# Patient Record
Sex: Female | Born: 1965 | Race: White | Hispanic: No | Smoking: Current some day smoker
Health system: Southern US, Community
[De-identification: ages and names within clinical notes are randomized; demographics above are authoritative.]

## PROBLEM LIST (undated history)

## (undated) DIAGNOSIS — E8801 Alpha-1-antitrypsin deficiency: Secondary | ICD-10-CM

## (undated) DIAGNOSIS — I639 Cerebral infarction, unspecified: Secondary | ICD-10-CM

## (undated) DIAGNOSIS — M329 Systemic lupus erythematosus, unspecified: Secondary | ICD-10-CM

## (undated) DIAGNOSIS — K859 Acute pancreatitis without necrosis or infection, unspecified: Secondary | ICD-10-CM

## (undated) HISTORY — PX: APPENDECTOMY: SHX54

## (undated) HISTORY — PX: HEMORROIDECTOMY: SUR656

## (undated) HISTORY — PX: CHOLECYSTECTOMY: SHX55

## (undated) HISTORY — PX: HERNIA REPAIR: SHX51

## (undated) HISTORY — PX: ABDOMINAL HYSTERECTOMY: SHX81

---

## 2009-06-01 ENCOUNTER — Emergency Department (HOSPITAL_COMMUNITY): Admission: EM | Admit: 2009-06-01 | Discharge: 2009-06-01 | Payer: Self-pay | Admitting: Emergency Medicine

## 2009-06-10 ENCOUNTER — Emergency Department (HOSPITAL_COMMUNITY): Admission: EM | Admit: 2009-06-10 | Discharge: 2009-06-11 | Payer: Self-pay | Admitting: Emergency Medicine

## 2009-07-05 ENCOUNTER — Emergency Department (HOSPITAL_COMMUNITY): Admission: EM | Admit: 2009-07-05 | Discharge: 2009-07-06 | Payer: Self-pay | Admitting: Emergency Medicine

## 2009-07-06 ENCOUNTER — Emergency Department (HOSPITAL_COMMUNITY): Admission: EM | Admit: 2009-07-06 | Discharge: 2009-07-06 | Payer: Self-pay | Admitting: Emergency Medicine

## 2009-07-09 ENCOUNTER — Emergency Department (HOSPITAL_COMMUNITY): Admission: EM | Admit: 2009-07-09 | Discharge: 2009-07-09 | Payer: Self-pay | Admitting: Emergency Medicine

## 2009-07-09 ENCOUNTER — Emergency Department (HOSPITAL_COMMUNITY): Admission: EM | Admit: 2009-07-09 | Discharge: 2009-07-10 | Payer: Self-pay | Admitting: Emergency Medicine

## 2009-07-11 ENCOUNTER — Emergency Department (HOSPITAL_COMMUNITY): Admission: EM | Admit: 2009-07-11 | Discharge: 2009-07-12 | Payer: Self-pay | Admitting: Emergency Medicine

## 2009-07-26 ENCOUNTER — Emergency Department (HOSPITAL_COMMUNITY): Admission: EM | Admit: 2009-07-26 | Discharge: 2009-07-26 | Payer: Self-pay | Admitting: Emergency Medicine

## 2010-04-21 LAB — CBC
HCT: 37.8 % (ref 36.0–46.0)
Hemoglobin: 14.3 g/dL (ref 12.0–15.0)
MCHC: 34.2 g/dL (ref 30.0–36.0)
MCHC: 34.4 g/dL (ref 30.0–36.0)
MCV: 92.2 fL (ref 78.0–100.0)
MCV: 92.5 fL (ref 78.0–100.0)
RBC: 4.11 MIL/uL (ref 3.87–5.11)
RBC: 4.24 MIL/uL (ref 3.87–5.11)
RDW: 13.1 % (ref 11.5–15.5)
RDW: 13.4 % (ref 11.5–15.5)
RDW: 13.5 % (ref 11.5–15.5)
WBC: 12.9 10*3/uL — ABNORMAL HIGH (ref 4.0–10.5)

## 2010-04-21 LAB — COMPREHENSIVE METABOLIC PANEL
ALT: 11 U/L (ref 0–35)
ALT: 17 U/L (ref 0–35)
AST: 31 U/L (ref 0–37)
Albumin: 4.1 g/dL (ref 3.5–5.2)
Albumin: 4.2 g/dL (ref 3.5–5.2)
Albumin: 4.2 g/dL (ref 3.5–5.2)
Alkaline Phosphatase: 87 U/L (ref 39–117)
Alkaline Phosphatase: 88 U/L (ref 39–117)
Alkaline Phosphatase: 95 U/L (ref 39–117)
BUN: 5 mg/dL — ABNORMAL LOW (ref 6–23)
BUN: 7 mg/dL (ref 6–23)
CO2: 24 mEq/L (ref 19–32)
CO2: 24 mEq/L (ref 19–32)
CO2: 25 mEq/L (ref 19–32)
Calcium: 9.3 mg/dL (ref 8.4–10.5)
Calcium: 9.4 mg/dL (ref 8.4–10.5)
Creatinine, Ser: 0.7 mg/dL (ref 0.4–1.2)
GFR calc Af Amer: >60 mL/min (ref >60)
GFR calc non Af Amer: >60 mL/min (ref >60)
GFR calc non Af Amer: >60 mL/min (ref >60)
GFR calc non Af Amer: >60 mL/min (ref >60)
Glucose, Bld: 115 mg/dL — ABNORMAL HIGH (ref 70–99)
Glucose, Bld: 93 mg/dL (ref 70–99)
Glucose, Bld: 97 mg/dL (ref 70–99)
Potassium: 3.5 mEq/L (ref 3.5–5.1)
Potassium: 4.2 mEq/L (ref 3.5–5.1)
Sodium: 141 mEq/L (ref 135–145)
Total Bilirubin: 0.2 mg/dL — ABNORMAL LOW (ref 0.3–1.2)
Total Bilirubin: 0.3 mg/dL (ref 0.3–1.2)
Total Bilirubin: 0.3 mg/dL (ref 0.3–1.2)
Total Bilirubin: 0.5 mg/dL (ref 0.3–1.2)
Total Protein: 6 g/dL (ref 6.0–8.3)
Total Protein: 6.8 g/dL (ref 6.0–8.3)
Total Protein: 6.9 g/dL (ref 6.0–8.3)

## 2010-04-21 LAB — SEDIMENTATION RATE: Sed Rate: 5 mm/hr (ref 0–22)

## 2010-04-21 LAB — URINALYSIS, ROUTINE W REFLEX MICROSCOPIC
Bilirubin Urine: NEGATIVE
Glucose, UA: NEGATIVE mg/dL
Glucose, UA: NEGATIVE mg/dL
Hgb urine dipstick: NEGATIVE
Hgb urine dipstick: NEGATIVE
Hgb urine dipstick: NEGATIVE
Protein, ur: NEGATIVE mg/dL
Protein, ur: NEGATIVE mg/dL
Protein, ur: NEGATIVE mg/dL
Specific Gravity, Urine: 1.01 (ref 1.005–1.030)
Specific Gravity, Urine: 1.015 (ref 1.005–1.030)
Specific Gravity, Urine: <1.005 — ABNORMAL LOW (ref 1.005–1.030)
Urobilinogen, UA: 0.2 mg/dL (ref 0.0–1.0)
Urobilinogen, UA: 0.2 mg/dL (ref 0.0–1.0)
pH: 7 (ref 5.0–8.0)
pH: 8 (ref 5.0–8.0)

## 2010-04-21 LAB — URINE CULTURE: Colony Count: NO GROWTH

## 2010-04-21 LAB — DIFFERENTIAL
Basophils Absolute: 0 10*3/uL (ref 0.0–0.1)
Basophils Absolute: 0.1 10*3/uL (ref 0.0–0.1)
Basophils Relative: 0 % (ref 0–1)
Basophils Relative: 1 % (ref 0–1)
Eosinophils Absolute: 0 10*3/uL (ref 0.0–0.7)
Eosinophils Relative: 0 % (ref 0–5)
Eosinophils Relative: 1 % (ref 0–5)
Lymphocytes Relative: 21 % (ref 12–46)
Lymphocytes Relative: 30 % (ref 12–46)
Lymphs Abs: 1 10*3/uL (ref 0.7–4.0)
Lymphs Abs: 2.7 10*3/uL (ref 0.7–4.0)
Lymphs Abs: 3.2 10*3/uL (ref 0.7–4.0)
Monocytes Absolute: 0.7 10*3/uL (ref 0.1–1.0)
Monocytes Absolute: 0.7 10*3/uL (ref 0.1–1.0)
Monocytes Relative: 6 % (ref 3–12)
Neutro Abs: 9.3 10*3/uL — ABNORMAL HIGH (ref 1.7–7.7)
Neutrophils Relative %: 61 % (ref 43–77)
Neutrophils Relative %: 72 % (ref 43–77)
Neutrophils Relative %: 86 % — ABNORMAL HIGH (ref 43–77)

## 2010-04-21 LAB — RAPID URINE DRUG SCREEN, HOSP PERFORMED
Opiates: POSITIVE — AB
Opiates: POSITIVE — AB
Tetrahydrocannabinol: NOT DETECTED

## 2010-04-21 LAB — LIPASE, BLOOD
Lipase: 22 U/L (ref 11–59)
Lipase: 24 U/L (ref 11–59)

## 2010-04-21 LAB — ROCKY MTN SPOTTED FVR AB, IGG-BLOOD: RMSF IgG: 0.33 IV

## 2010-04-21 LAB — ROCKY MTN SPOTTED FVR AB, IGM-BLOOD: RMSF IgM: 0.13 IV (ref 0.00–0.89)

## 2010-04-22 LAB — COMPREHENSIVE METABOLIC PANEL
Albumin: 4.5 g/dL (ref 3.5–5.2)
Alkaline Phosphatase: 83 U/L (ref 39–117)
BUN: 7 mg/dL (ref 6–23)
Calcium: 9.5 mg/dL (ref 8.4–10.5)
Creatinine, Ser: 0.73 mg/dL (ref 0.4–1.2)
Glucose, Bld: 90 mg/dL (ref 70–99)
Potassium: 3.6 mEq/L (ref 3.5–5.1)
Total Protein: 7.7 g/dL (ref 6.0–8.3)

## 2010-04-22 LAB — DIFFERENTIAL
Lymphocytes Relative: 28 % (ref 12–46)
Lymphs Abs: 4.2 10*3/uL — ABNORMAL HIGH (ref 0.7–4.0)
Monocytes Absolute: 0.5 10*3/uL (ref 0.1–1.0)
Monocytes Relative: 3 % (ref 3–12)
Neutro Abs: 10 10*3/uL — ABNORMAL HIGH (ref 1.7–7.7)
Neutrophils Relative %: 67 % (ref 43–77)

## 2010-04-22 LAB — CBC
HCT: 42.8 % (ref 36.0–46.0)
Hemoglobin: 14.9 g/dL (ref 12.0–15.0)
MCV: 92.2 fL (ref 78.0–100.0)
Platelets: 233 10*3/uL (ref 150–400)

## 2010-04-22 LAB — ETHANOL: Alcohol, Ethyl (B): <5 mg/dL (ref 0–10)

## 2011-12-01 IMAGING — CT CT ABD-PELV W/O CM
2 of 4 series · 16 of 46 positions shown, 18 images · non-contrast
Comparison: None

CLINICAL DATA: Severe abdominal pain.  History of pancreatitis.

CT ABDOMEN AND PELVIS WITHOUT CONTRAST
TECHNIQUE: Multidetector CT imaging of the abdomen and pelvis was
performed following the standard protocol without intravenous
contrast.

[Series 2: abd|pel w/o 5.0 b40f · axial · non-contrast · 0.60mm/px · z∈[+484,+860]mm · 13 of 83 slices shown, 15 images]
[im 4/83  soft-tissue]
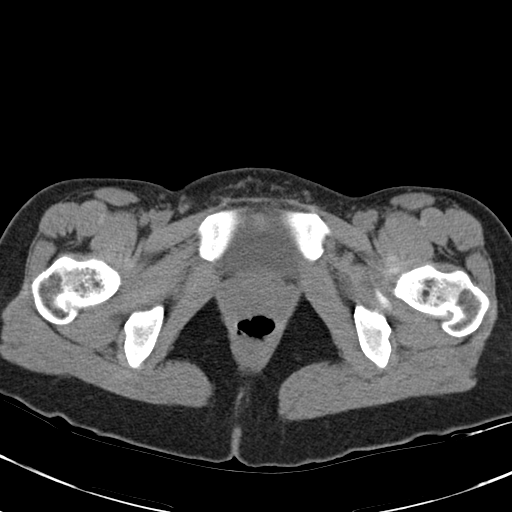
[im 4/83  bone]
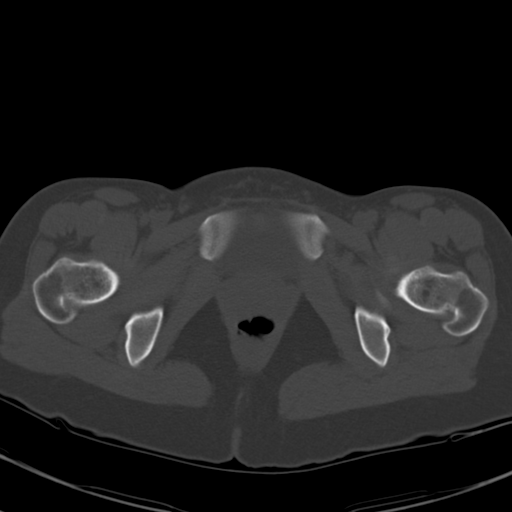
[im 10/83  soft-tissue]
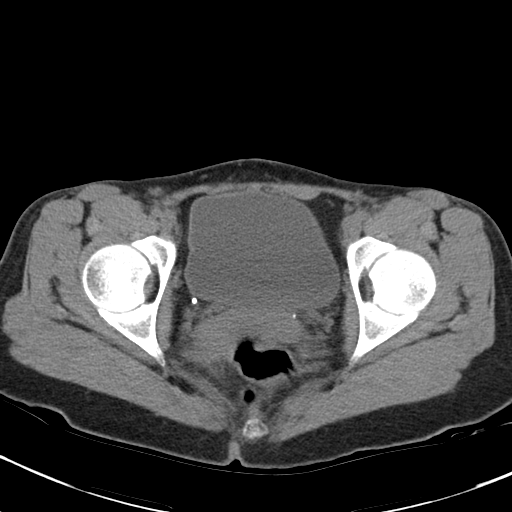
[im 16/83  soft-tissue]
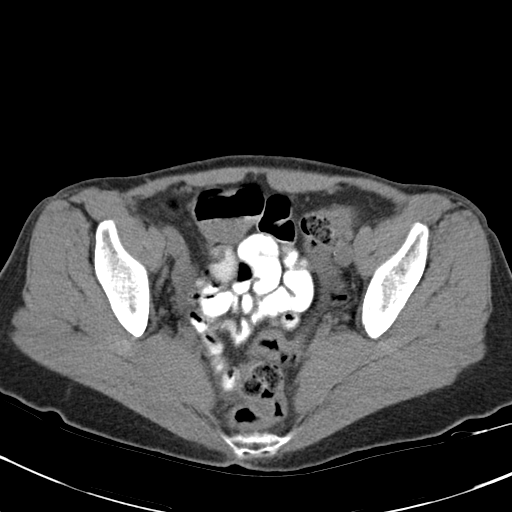
[im 23/83  soft-tissue]
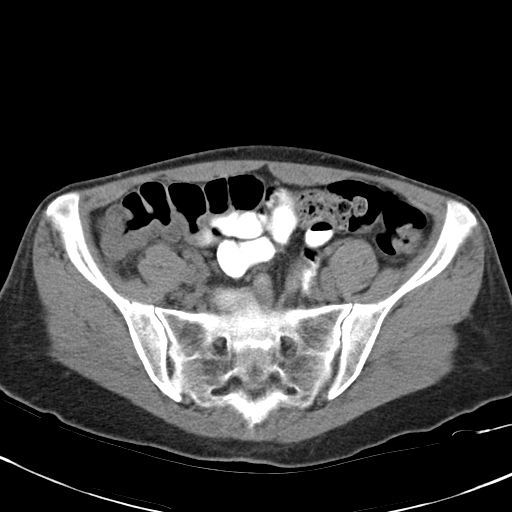
[im 29/83  soft-tissue]
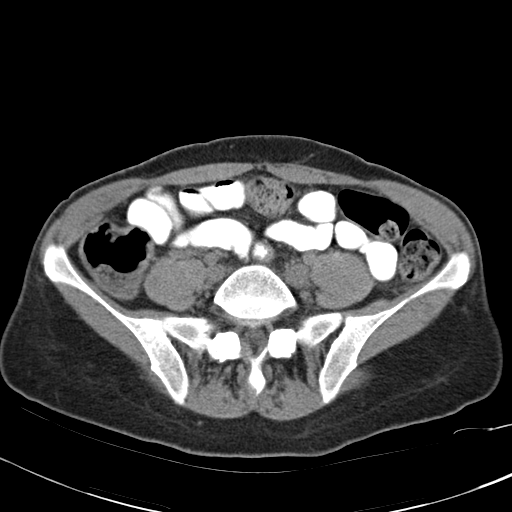
[im 35/83  soft-tissue]
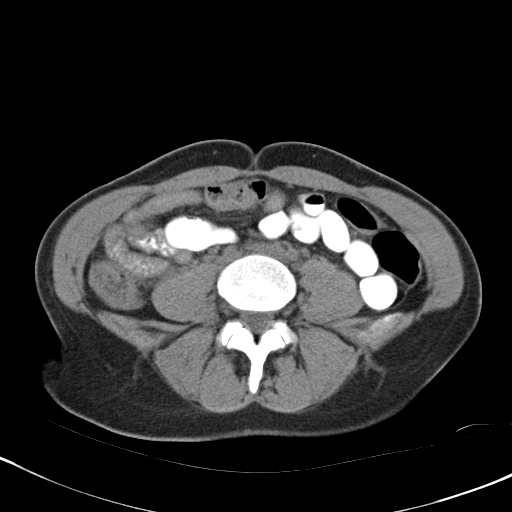
[im 42/83  soft-tissue]
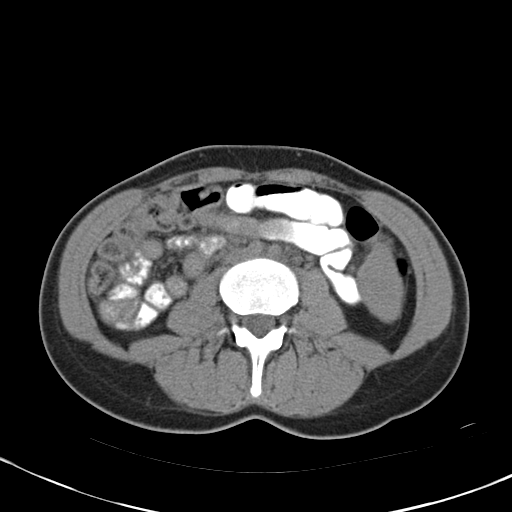
[im 48/83  soft-tissue]
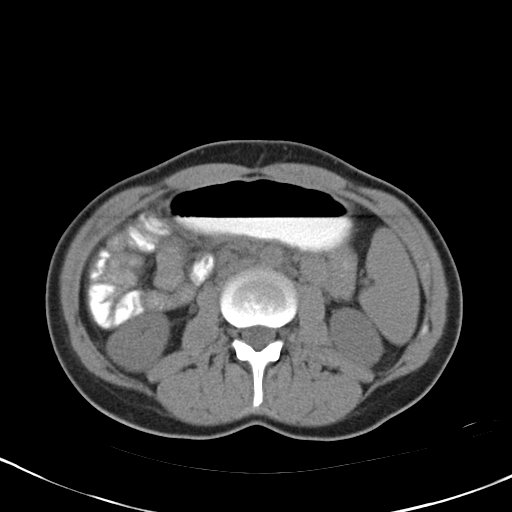
[im 54/83  soft-tissue]
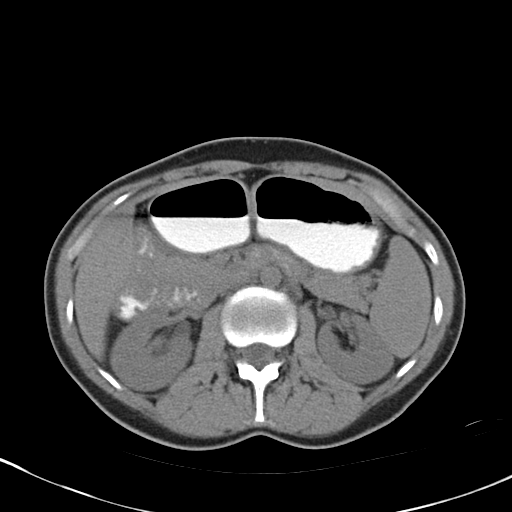
[im 54/83  bone]
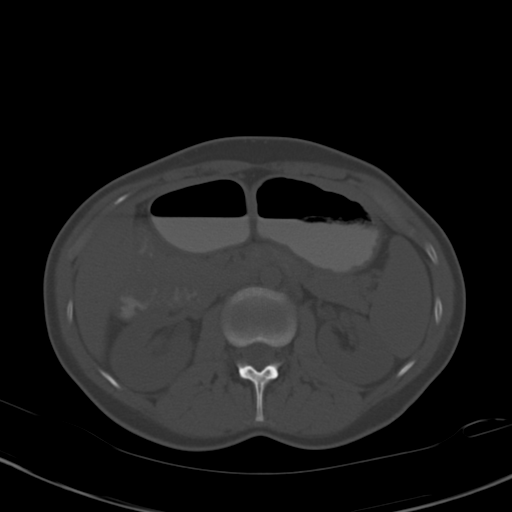
[im 60/83  soft-tissue]
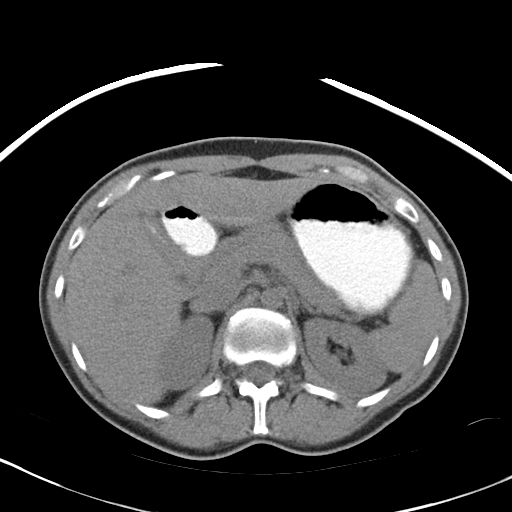
[im 67/83  soft-tissue]
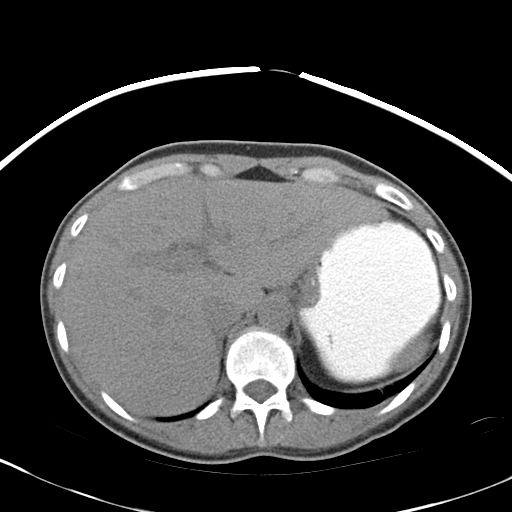
[im 73/83  soft-tissue]
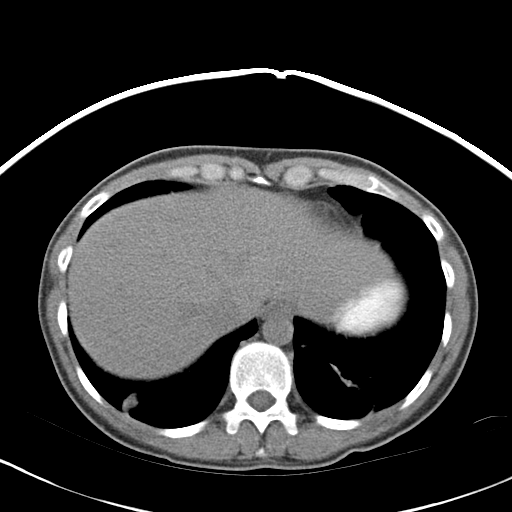
[im 79/83  soft-tissue]
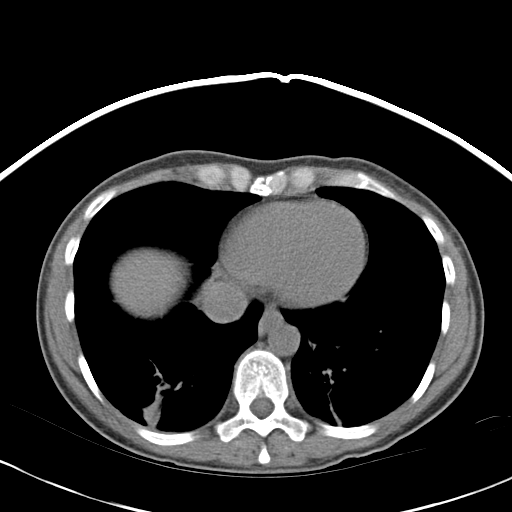

[Series 4: mpr cor (id) · coronal · 0.54mm/px · 3 of 58 slices shown]
[im 20/58  soft-tissue]
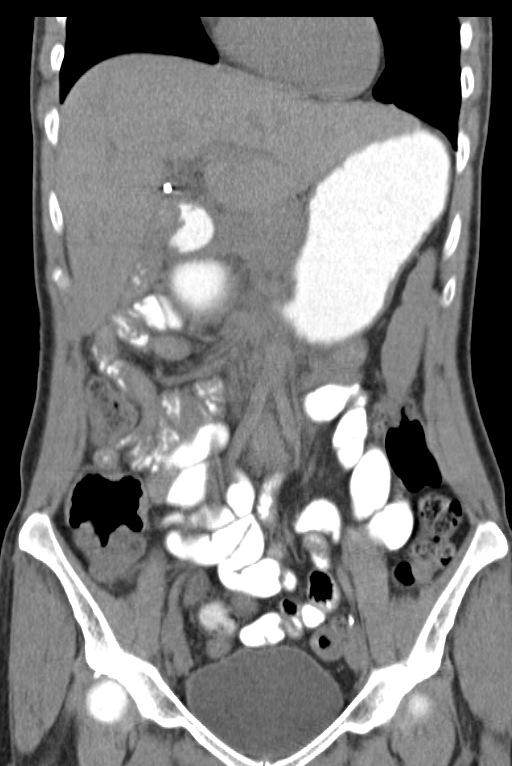
[im 26/58  soft-tissue]
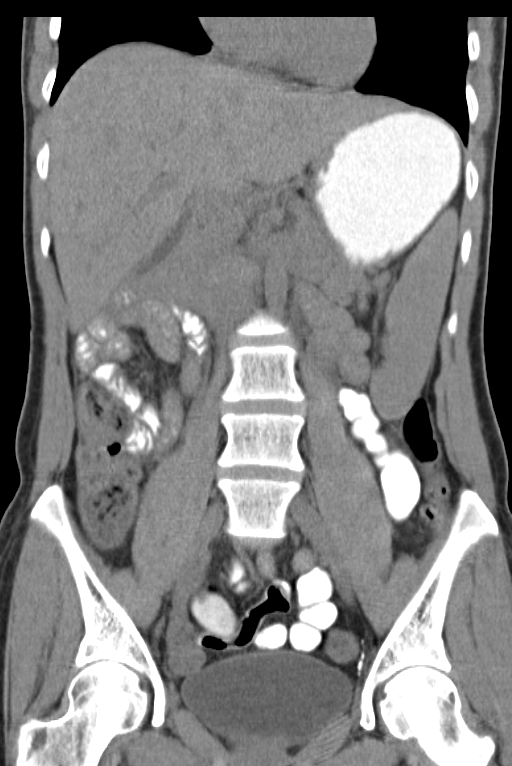
[im 32/58  soft-tissue]
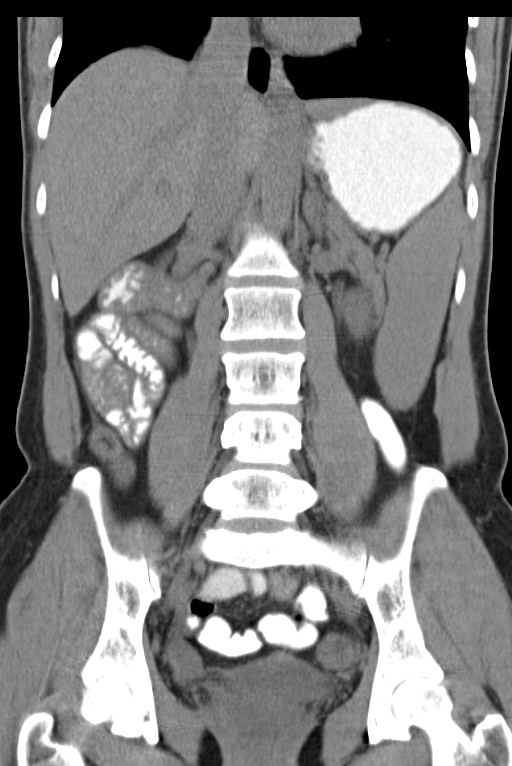

[16 of 46 positions shown; findings below may reference images not displayed]

FINDINGS: The liver is normal.  The gallbladder has been removed.
The pancreas is normal.  There are no pancreatic calcifications or
edema.  The spleen and kidneys are normal.  No renal calculi or
obstruction.

The bowel is not dilated or thickened.  There is no adenopathy.  No
free fluid is present.  The appendix is not visualized.

There is scarring in the lung bases.
IMPRESSION: No acute abnormality.  The appendix is not visualized but no
evidence for acute appendicitis.

## 2013-12-08 ENCOUNTER — Encounter (HOSPITAL_COMMUNITY): Payer: Self-pay | Admitting: *Deleted

## 2013-12-08 ENCOUNTER — Emergency Department (HOSPITAL_COMMUNITY)
Admission: EM | Admit: 2013-12-08 | Discharge: 2013-12-09 | Disposition: A | Discharge status: Home / Self Care | Payer: Medicaid - Out of State | Attending: Emergency Medicine | Admitting: Emergency Medicine

## 2013-12-08 DIAGNOSIS — Z720 Tobacco use: Secondary | ICD-10-CM | POA: Diagnosis not present

## 2013-12-08 DIAGNOSIS — R1010 Upper abdominal pain, unspecified: Secondary | ICD-10-CM | POA: Diagnosis not present

## 2013-12-08 DIAGNOSIS — K5900 Constipation, unspecified: Secondary | ICD-10-CM

## 2013-12-08 DIAGNOSIS — Z8673 Personal history of transient ischemic attack (TIA), and cerebral infarction without residual deficits: Secondary | ICD-10-CM | POA: Diagnosis not present

## 2013-12-08 DIAGNOSIS — Z8639 Personal history of other endocrine, nutritional and metabolic disease: Secondary | ICD-10-CM | POA: Diagnosis not present

## 2013-12-08 DIAGNOSIS — R109 Unspecified abdominal pain: Secondary | ICD-10-CM

## 2013-12-08 HISTORY — DX: Systemic lupus erythematosus, unspecified: M32.9

## 2013-12-08 HISTORY — DX: Acute pancreatitis without necrosis or infection, unspecified: K85.90

## 2013-12-08 HISTORY — DX: Cerebral infarction, unspecified: I63.9

## 2013-12-08 HISTORY — DX: Alpha-1-antitrypsin deficiency: E88.01

## 2013-12-08 MED ORDER — ONDANSETRON HCL 4 MG/2ML IJ SOLN
4.0000 mg | Freq: Once | INTRAMUSCULAR | Status: DC
  Filled 2013-12-08: qty 2

## 2013-12-08 MED ORDER — SODIUM CHLORIDE 0.9 % IV BOLUS (SEPSIS)
1000.0000 mL | Freq: Once | INTRAVENOUS | Status: AC
  Administered 2013-12-09: 1000 mL via INTRAVENOUS

## 2013-12-08 MED ORDER — HYDROMORPHONE HCL 1 MG/ML IJ SOLN
1.0000 mg | Freq: Once | INTRAMUSCULAR | Status: AC
  Administered 2013-12-09: 1 mg via INTRAVENOUS
  Filled 2013-12-08: qty 1

## 2013-12-08 NOTE — ED Notes (Signed)
Dr. Molpus at bedside. 

## 2013-12-08 NOTE — ED Provider Notes (Addendum)
CSN: [id number]     Arrival date & time 12/08/13  2233 History  This chart was scribed for Deanna SeamenJohn L Adeel Guiffre, MD by Gwenyth Oberatherine Macek, ED Scribe. This patient was seen in room [facility]01/[facility]01 and the patient's care was started at 11:44 PM.     Chief Complaint  Patient presents with  . Abdominal Pain   The history is provided by the patient. No language interpreter was used.    HPI Comments: Deanna Dixon is a 48 y.o. female with a history of pancreatitis who presents to the Emergency Department complaining of constant, dull, aching upper abdominal pain and swelling that started 5 days ago. She states several episodes of vomiting, loose bowel movements, difficulty breathing due to pain and a fever of 100 as associated symptoms. She also notes baseline SOB consistent with history of COPD. Pt has tried [employer] and an enema with no relief of symptoms. [facility] and was diagnosed with constipation. She does not remember if they took x-rays.  She states current symptoms are not similar to previous pancreatitis.  Past Medical History  Diagnosis Date  . Alpha-1-antitrypsin deficiency   . Pancreatitis   . Systemic lupus   . Stroke    Past Surgical History  Procedure Laterality Date  . Hernia repair    . Cholecystectomy    . Appendectomy    . Hemorroidectomy    . Abdominal hysterectomy     History reviewed. No pertinent family history. History  Substance Use Topics  . Smoking status: Current Some Day Smoker  . Smokeless tobacco: Not on file  . Alcohol Use: No   OB History    No data available     Review of Systems  10 Systems reviewed and all are negative for acute change except as noted in the HPI.   Allergies  Ativan; Decadron; Demerol; Fentanyl; Nubain; Toradol; Tramadol; and Zofran  Home Medications   Prior to Admission medications   Not on File   BP 120/68 mmHg  Pulse 78  Temp(Src) 97.6 F (36.4 C) (Oral)  Resp 16  Ht 5\' 4"  (1.626 m)  Wt 127  lb (57.607 kg)  BMI 21.79 kg/m2  SpO2 94% Physical Exam  Nursing note and vitals reviewed.   General: Well-developed, well-nourished female in no acute distress; appearance consistent with age of record HENT: normocephalic; atraumatic; trace edema of lower legs Eyes: pupils equal, round and reactive to light; extraocular muscles intact Neck: supple Heart: regular rate and rhythm; no murmurs, rubs or gallops Lungs: clear to auscultation bilaterally; shallow breaths Abdomen: soft; distended; epigastric tenderness; bowel sounds present Extremities: No deformity; full range of motion; pulses normal Neurologic: Awake, alert and oriented; motor function intact in all extremities and symmetric; no facial droop Skin: Warm and dry Psychiatric: flat affect  ED Course  Procedures (including critical care time) DIAGNOSTIC STUDIES: Oxygen Saturation is 94% on RA, adequate by my interpretation.    COORDINATION OF CARE: 11:56 PM Discussed treatment plan with pt at bedside and pt agreed to plan.  MDM   Results for orders placed or performed during the hospital encounter of 12/08/13  Comprehensive metabolic panel  Result Value Ref Range   Sodium 138 137 - 147 mEq/L   Potassium 3.5 (L) 3.7 - 5.3 mEq/L   Chloride 102 96 - 112 mEq/L   CO2 23 19 - 32 mEq/L   Glucose, Bld 104 (H) 70 - 99 mg/dL   BUN 6 6 - 23  mg/dL   Creatinine, Ser 1.610.90 0.50 - 1.10 mg/dL   Calcium 8.8 8.4 - 09.610.5 mg/dL   Total Protein 6.9 6.0 - 8.3 g/dL   Albumin 3.7 3.5 - 5.2 g/dL   AST 21 0 - 37 U/L   ALT 8 0 - 35 U/L   Alkaline Phosphatase 113 39 - 117 U/L   Total Bilirubin <0.2 (L) 0.3 - 1.2 mg/dL   GFR calc non Af Amer 75 (L) >90 mL/min   GFR calc Af Amer 87 (L) >90 mL/min   Anion gap 13 5 - 15  Lipase, blood  Result Value Ref Range   Lipase 27 11 - 59 U/L  CBC with Differential  Result Value Ref Range   WBC 10.6 (H) 4.0 - 10.5 K/uL   RBC 4.22 3.87 - 5.11 MIL/uL   Hemoglobin 12.8 12.0 - 15.0 g/dL   HCT 04.537.6  40.936.0 - 81.146.0 %   MCV 89.1 78.0 - 100.0 fL   MCH 30.3 26.0 - 34.0 pg   MCHC 34.0 30.0 - 36.0 g/dL   RDW 91.414.7 78.211.5 - 95.615.5 %   Platelets 221 150 - 400 K/uL   Neutrophils Relative % 61 43 - 77 %   Neutro Abs 6.4 1.7 - 7.7 K/uL   Lymphocytes Relative 29 12 - 46 %   Lymphs Abs 3.1 0.7 - 4.0 K/uL   Monocytes Relative 7 3 - 12 %   Monocytes Absolute 0.7 0.1 - 1.0 K/uL   Eosinophils Relative 3 0 - 5 %   Eosinophils Absolute 0.3 0.0 - 0.7 K/uL   Basophils Relative 0 0 - 1 %   Basophils Absolute 0.0 0.0 - 0.1 K/uL  Urinalysis, Routine w reflex microscopic  Result Value Ref Range   Color, Urine YELLOW YELLOW   APPearance CLEAR CLEAR   Specific Gravity, Urine 1.010 1.005 - 1.030   pH 6.5 5.0 - 8.0   Glucose, UA NEGATIVE NEGATIVE mg/dL   Hgb urine dipstick NEGATIVE NEGATIVE   Bilirubin Urine NEGATIVE NEGATIVE   Ketones, ur NEGATIVE NEGATIVE mg/dL   Protein, ur NEGATIVE NEGATIVE mg/dL   Urobilinogen, UA 0.2 0.0 - 1.0 mg/dL   Nitrite NEGATIVE NEGATIVE   Leukocytes, UA NEGATIVE NEGATIVE  Troponin I  Result Value Ref Range   Troponin I <0.30 <0.30 ng/mL   Ct Abdomen Pelvis Wo Contrast  12/09/2013   CLINICAL DATA:  Upper abdominal pain and swelling for 5 days. Shortness of breath. Images obtained without IV contrast material due to lack of IV access.  EXAM: CT ABDOMEN AND PELVIS WITHOUT CONTRAST  TECHNIQUE: Multidetector CT imaging of the abdomen and pelvis was performed following the standard protocol without IV contrast.  COMPARISON:  10/08/2013  FINDINGS: Emphysematous changes, linear scarring, bronchiectasis, and focal consolidation demonstrated in the lung bases, similar to prior study.  Surgical absence of the gallbladder. The unenhanced appearance liver, spleen, pancreas, adrenal glands, kidneys, abdominal aorta, inferior vena cava, and retroperitoneal lymph nodes are unremarkable. Stomach, small bowel, and colon appear normal. No free air or free fluid in the abdomen.  Pelvis: Bladder wall is  not thickened. Uterus is surgically absent. No free or loculated pelvic fluid collections. No pelvic mass or lymphadenopathy. Appendix is not identified. No destructive bone lesions.  IMPRESSION: No acute process demonstrated in the abdomen and pelvis. Persistent emphysematous changes, linear scarring, bronchiectasis, and patchy consolidation in the lung bases.   Electronically Signed   By: Burman NievesWilliam  Stevens M.D.   On: 12/09/2013 03:55   Dg  Abd Acute W/chest  12/09/2013   CLINICAL DATA:  Dull aching upper abdominal pain. Shortness breath and fever. Symptoms 5 days  EXAM: ACUTE ABDOMEN SERIES (ABDOMEN 2 VIEW & CHEST 1 VIEW)  COMPARISON:  Radiograph 11/11/2013  FINDINGS: There is linear scarring at the left and right lung base similar prior. Lungs are hyperinflated. No focal consolidation. No pneumothorax. No free air beneath hemidiaphragms.  The large volume stool in the right colon. Gas the rectum. No pathologic calcifications.  IMPRESSION: 1. Chronic bibasilar scarring and atelectasis. 2. No evidence of bowel obstruction. 3. Large volume stool in the right colon suggest constipation.   Electronically Signed   By: Genevive Bi M.D.   On: 12/09/2013 01:54      Deanna Seamen, MD 12/09/13 1610  Deanna Seamen, MD 12/09/13 [phone]

## 2013-12-08 NOTE — ED Notes (Addendum)
Pt c/o abdominal pain, bloating, and n/v x 6 days. Last BM was tonight at 9 pm. Pt also c/o chest pain that radiates around under her left breast. Pt states [place] is [place].

## 2013-12-09 ENCOUNTER — Emergency Department (HOSPITAL_COMMUNITY): Payer: Medicaid - Out of State

## 2013-12-09 LAB — COMPREHENSIVE METABOLIC PANEL
ALBUMIN: 3.7 g/dL (ref 3.5–5.2)
ALT: 8 U/L (ref 0–35)
ANION GAP: 13 (ref 5–15)
AST: 21 U/L (ref 0–37)
Alkaline Phosphatase: 113 U/L (ref 39–117)
BUN: 6 mg/dL (ref 6–23)
CO2: 23 mEq/L (ref 19–32)
CREATININE: 0.9 mg/dL (ref 0.50–1.10)
Calcium: 8.8 mg/dL (ref 8.4–10.5)
Chloride: 102 mEq/L (ref 96–112)
GFR calc Af Amer: 87 mL/min — ABNORMAL LOW (ref >90)
GFR calc non Af Amer: 75 mL/min — ABNORMAL LOW (ref >90)
Glucose, Bld: 104 mg/dL — ABNORMAL HIGH (ref 70–99)
Potassium: 3.5 mEq/L — ABNORMAL LOW (ref 3.7–5.3)
Sodium: 138 mEq/L (ref 137–147)
TOTAL PROTEIN: 6.9 g/dL (ref 6.0–8.3)

## 2013-12-09 LAB — CBC WITH DIFFERENTIAL/PLATELET
BASOS ABS: 0 10*3/uL (ref 0.0–0.1)
BASOS PCT: 0 % (ref 0–1)
Eosinophils Absolute: 0.3 10*3/uL (ref 0.0–0.7)
Eosinophils Relative: 3 % (ref 0–5)
HCT: 37.6 % (ref 36.0–46.0)
HEMOGLOBIN: 12.8 g/dL (ref 12.0–15.0)
Lymphocytes Relative: 29 % (ref 12–46)
Lymphs Abs: 3.1 10*3/uL (ref 0.7–4.0)
MCH: 30.3 pg (ref 26.0–34.0)
MCHC: 34 g/dL (ref 30.0–36.0)
MCV: 89.1 fL (ref 78.0–100.0)
MONOS PCT: 7 % (ref 3–12)
Monocytes Absolute: 0.7 10*3/uL (ref 0.1–1.0)
NEUTROS ABS: 6.4 10*3/uL (ref 1.7–7.7)
NEUTROS PCT: 61 % (ref 43–77)
Platelets: 221 10*3/uL (ref 150–400)
RBC: 4.22 MIL/uL (ref 3.87–5.11)
RDW: 14.7 % (ref 11.5–15.5)
WBC: 10.6 10*3/uL — ABNORMAL HIGH (ref 4.0–10.5)

## 2013-12-09 LAB — URINALYSIS, ROUTINE W REFLEX MICROSCOPIC
Bilirubin Urine: NEGATIVE
Glucose, UA: NEGATIVE mg/dL
HGB URINE DIPSTICK: NEGATIVE
Ketones, ur: NEGATIVE mg/dL
LEUKOCYTES UA: NEGATIVE
NITRITE: NEGATIVE
Protein, ur: NEGATIVE mg/dL
SPECIFIC GRAVITY, URINE: 1.01 (ref 1.005–1.030)
UROBILINOGEN UA: 0.2 mg/dL (ref 0.0–1.0)
pH: 6.5 (ref 5.0–8.0)

## 2013-12-09 LAB — TROPONIN I

## 2013-12-09 LAB — LIPASE, BLOOD: LIPASE: 27 U/L (ref 11–59)

## 2013-12-09 MED ORDER — PROMETHAZINE HCL 25 MG/ML IJ SOLN
12.5000 mg | Freq: Once | INTRAMUSCULAR | Status: AC
  Administered 2013-12-09: 12.5 mg via INTRAVENOUS
  Filled 2013-12-09: qty 1

## 2013-12-09 MED ORDER — PROMETHAZINE HCL 25 MG/ML IJ SOLN
12.5000 mg | Freq: Once | INTRAMUSCULAR | Status: AC
Start: 2013-12-09 — End: 2013-12-09
  Administered 2013-12-09: 12.5 mg via INTRAVENOUS
  Filled 2013-12-09: qty 1

## 2013-12-09 MED ORDER — PROMETHAZINE HCL 25 MG PO TABS: 25.0000 mg | ORAL_TABLET | Freq: Four times a day (QID) | ORAL | Status: AC | PRN

## 2013-12-09 MED ORDER — HYDROMORPHONE HCL 1 MG/ML IJ SOLN
1.0000 mg | Freq: Once | INTRAMUSCULAR | Status: AC
  Administered 2013-12-09: 1 mg via INTRAVENOUS
  Filled 2013-12-09: qty 1

## 2013-12-09 MED ORDER — POLYETHYLENE GLYCOL 3350 17 G PO PACK: 17.0000 g | PACK | Freq: Every day | ORAL | Status: AC | PRN
